# Patient Record
Sex: Female | Born: 1950 | Race: White | Hispanic: No | Smoking: Former smoker
Health system: Southern US, Community
[De-identification: ages and names within clinical notes are randomized; demographics above are authoritative.]

## PROBLEM LIST (undated history)

## (undated) DIAGNOSIS — N651 Disproportion of reconstructed breast: Secondary | ICD-10-CM

## (undated) DIAGNOSIS — M1990 Unspecified osteoarthritis, unspecified site: Secondary | ICD-10-CM

## (undated) DIAGNOSIS — I2510 Atherosclerotic heart disease of native coronary artery without angina pectoris: Secondary | ICD-10-CM

## (undated) DIAGNOSIS — C801 Malignant (primary) neoplasm, unspecified: Secondary | ICD-10-CM

## (undated) HISTORY — PX: CHOLECYSTECTOMY: SHX55

---

## 2020-04-08 ENCOUNTER — Other Ambulatory Visit: Payer: Self-pay | Admitting: Registered Nurse

## 2020-04-29 ENCOUNTER — Other Ambulatory Visit: Payer: Self-pay | Admitting: Registered Nurse

## 2020-04-29 DIAGNOSIS — E2839 Other primary ovarian failure: Secondary | ICD-10-CM

## 2020-04-29 DIAGNOSIS — Z1231 Encounter for screening mammogram for malignant neoplasm of breast: Secondary | ICD-10-CM

## 2020-06-03 ENCOUNTER — Ambulatory Visit
Admission: RE | Admit: 2020-06-03 | Discharge: 2020-06-03 | Disposition: A | Discharge status: Home / Self Care | Payer: Medicare HMO | Source: Ambulatory Visit | Attending: Registered Nurse | Admitting: Registered Nurse

## 2020-06-03 ENCOUNTER — Other Ambulatory Visit: Payer: Self-pay | Admitting: Registered Nurse

## 2020-06-03 ENCOUNTER — Other Ambulatory Visit: Payer: Self-pay

## 2020-06-03 DIAGNOSIS — R0602 Shortness of breath: Secondary | ICD-10-CM

## 2020-06-14 ENCOUNTER — Other Ambulatory Visit: Payer: Self-pay | Admitting: Registered Nurse

## 2020-06-14 DIAGNOSIS — R0602 Shortness of breath: Secondary | ICD-10-CM

## 2020-06-14 DIAGNOSIS — R7989 Other specified abnormal findings of blood chemistry: Secondary | ICD-10-CM

## 2020-10-05 ENCOUNTER — Ambulatory Visit: Payer: Self-pay

## 2020-10-05 ENCOUNTER — Other Ambulatory Visit: Payer: Self-pay

## 2020-10-21 ENCOUNTER — Ambulatory Visit
Admission: RE | Admit: 2020-10-21 | Discharge: 2020-10-21 | Disposition: A | Discharge status: Home / Self Care | Payer: Medicare HMO | Source: Ambulatory Visit | Attending: Registered Nurse | Admitting: Registered Nurse

## 2020-10-21 ENCOUNTER — Other Ambulatory Visit: Payer: Self-pay

## 2020-10-21 DIAGNOSIS — E2839 Other primary ovarian failure: Secondary | ICD-10-CM

## 2020-10-21 DIAGNOSIS — Z1231 Encounter for screening mammogram for malignant neoplasm of breast: Secondary | ICD-10-CM

## 2020-10-22 ENCOUNTER — Other Ambulatory Visit: Payer: Self-pay | Admitting: Registered Nurse

## 2020-10-22 DIAGNOSIS — N63 Unspecified lump in unspecified breast: Secondary | ICD-10-CM

## 2021-01-13 ENCOUNTER — Other Ambulatory Visit (HOSPITAL_BASED_OUTPATIENT_CLINIC_OR_DEPARTMENT_OTHER): Payer: Self-pay

## 2021-03-09 ENCOUNTER — Other Ambulatory Visit: Payer: Self-pay

## 2021-03-09 ENCOUNTER — Emergency Department (HOSPITAL_BASED_OUTPATIENT_CLINIC_OR_DEPARTMENT_OTHER): Payer: Medicare HMO

## 2021-03-09 ENCOUNTER — Encounter (HOSPITAL_BASED_OUTPATIENT_CLINIC_OR_DEPARTMENT_OTHER): Payer: Self-pay

## 2021-03-09 ENCOUNTER — Emergency Department (HOSPITAL_BASED_OUTPATIENT_CLINIC_OR_DEPARTMENT_OTHER)
Admission: EM | Admit: 2021-03-09 | Discharge: 2021-03-09 | Disposition: A | Discharge status: Home / Self Care | Payer: Medicare HMO | Attending: Emergency Medicine | Admitting: Emergency Medicine

## 2021-03-09 ENCOUNTER — Other Ambulatory Visit (HOSPITAL_BASED_OUTPATIENT_CLINIC_OR_DEPARTMENT_OTHER): Payer: Self-pay

## 2021-03-09 DIAGNOSIS — K529 Noninfective gastroenteritis and colitis, unspecified: Secondary | ICD-10-CM | POA: Insufficient documentation

## 2021-03-09 DIAGNOSIS — R1011 Right upper quadrant pain: Secondary | ICD-10-CM

## 2021-03-09 DIAGNOSIS — Z859 Personal history of malignant neoplasm, unspecified: Secondary | ICD-10-CM | POA: Diagnosis not present

## 2021-03-09 DIAGNOSIS — Z20822 Contact with and (suspected) exposure to covid-19: Secondary | ICD-10-CM | POA: Insufficient documentation

## 2021-03-09 DIAGNOSIS — D72829 Elevated white blood cell count, unspecified: Secondary | ICD-10-CM | POA: Diagnosis not present

## 2021-03-09 DIAGNOSIS — Z87891 Personal history of nicotine dependence: Secondary | ICD-10-CM | POA: Diagnosis not present

## 2021-03-09 DIAGNOSIS — I2510 Atherosclerotic heart disease of native coronary artery without angina pectoris: Secondary | ICD-10-CM | POA: Diagnosis not present

## 2021-03-09 DIAGNOSIS — R109 Unspecified abdominal pain: Secondary | ICD-10-CM

## 2021-03-09 HISTORY — DX: Unspecified osteoarthritis, unspecified site: M19.90

## 2021-03-09 HISTORY — DX: Atherosclerotic heart disease of native coronary artery without angina pectoris: I25.10

## 2021-03-09 HISTORY — DX: Disproportion of reconstructed breast: N65.1

## 2021-03-09 HISTORY — DX: Malignant (primary) neoplasm, unspecified: C80.1

## 2021-03-09 LAB — CBC WITH DIFFERENTIAL/PLATELET
Abs Immature Granulocytes: 0.09 10*3/uL — ABNORMAL HIGH (ref 0.00–0.07)
Basophils Absolute: 0 10*3/uL (ref 0.0–0.1)
Basophils Relative: 0 %
Eosinophils Absolute: 0.3 10*3/uL (ref 0.0–0.5)
Eosinophils Relative: 3 %
HCT: 48.2 % — ABNORMAL HIGH (ref 36.0–46.0)
Hemoglobin: 16 g/dL — ABNORMAL HIGH (ref 12.0–15.0)
Immature Granulocytes: 1 %
Lymphocytes Relative: 16 %
Lymphs Abs: 1.7 10*3/uL (ref 0.7–4.0)
MCH: 32.1 pg (ref 26.0–34.0)
MCHC: 33.2 g/dL (ref 30.0–36.0)
MCV: 96.8 fL (ref 80.0–100.0)
Monocytes Absolute: 0.7 10*3/uL (ref 0.1–1.0)
Monocytes Relative: 7 %
Neutro Abs: 7.8 10*3/uL — ABNORMAL HIGH (ref 1.7–7.7)
Neutrophils Relative %: 73 %
Platelets: 252 10*3/uL (ref 150–400)
RBC: 4.98 MIL/uL (ref 3.87–5.11)
RDW: 13.2 % (ref 11.5–15.5)
WBC: 10.6 10*3/uL — ABNORMAL HIGH (ref 4.0–10.5)
nRBC: 0 % (ref 0.0–0.2)

## 2021-03-09 LAB — COMPREHENSIVE METABOLIC PANEL
ALT: 25 U/L (ref 0–44)
AST: 19 U/L (ref 15–41)
Albumin: 3.8 g/dL (ref 3.5–5.0)
Alkaline Phosphatase: 79 U/L (ref 38–126)
Anion gap: 10 (ref 5–15)
BUN: 12 mg/dL (ref 8–23)
CO2: 25 mmol/L (ref 22–32)
Calcium: 8.4 mg/dL — ABNORMAL LOW (ref 8.9–10.3)
Chloride: 104 mmol/L (ref 98–111)
Creatinine, Ser: 0.62 mg/dL (ref 0.44–1.00)
GFR, Estimated: >60 mL/min (ref >60)
Glucose, Bld: 86 mg/dL (ref 70–99)
Potassium: 3.7 mmol/L (ref 3.5–5.1)
Sodium: 139 mmol/L (ref 135–145)
Total Bilirubin: 0.6 mg/dL (ref 0.3–1.2)
Total Protein: 6.4 g/dL — ABNORMAL LOW (ref 6.5–8.1)

## 2021-03-09 LAB — URINALYSIS, ROUTINE W REFLEX MICROSCOPIC
Bilirubin Urine: NEGATIVE
Glucose, UA: NEGATIVE mg/dL
Ketones, ur: 15 mg/dL — AB
Leukocytes,Ua: NEGATIVE
Nitrite: NEGATIVE
Protein, ur: NEGATIVE mg/dL
Specific Gravity, Urine: >1.046 — ABNORMAL HIGH (ref 1.005–1.030)
pH: 7.5 (ref 5.0–8.0)

## 2021-03-09 LAB — RESP PANEL BY RT-PCR (FLU A&B, COVID) ARPGX2
Influenza A by PCR: NEGATIVE
Influenza B by PCR: NEGATIVE
SARS Coronavirus 2 by RT PCR: NEGATIVE

## 2021-03-09 LAB — C DIFFICILE QUICK SCREEN W PCR REFLEX
C Diff antigen: NEGATIVE
C Diff interpretation: NOT DETECTED
C Diff toxin: NEGATIVE

## 2021-03-09 LAB — LIPASE, BLOOD: Lipase: 14 U/L (ref 11–51)

## 2021-03-09 MED ORDER — ONDANSETRON 4 MG PO TBDP
4.0000 mg | ORAL_TABLET | Freq: Once | ORAL | Status: AC
  Administered 2021-03-09: 4 mg via ORAL
  Filled 2021-03-09: qty 1

## 2021-03-09 MED ORDER — SODIUM CHLORIDE 0.9 % IV BOLUS
500.0000 mL | Freq: Once | INTRAVENOUS | Status: AC
  Administered 2021-03-09: 500 mL via INTRAVENOUS

## 2021-03-09 MED ORDER — VANCOMYCIN HCL 125 MG PO CAPS
125.0000 mg | ORAL_CAPSULE | Freq: Four times a day (QID) | ORAL | 0 refills | Status: AC
  Filled 2021-03-09: qty 40, 10d supply, fill #0

## 2021-03-09 MED ORDER — ONDANSETRON 4 MG PO TBDP: ORAL_TABLET | ORAL | 0 refills | Status: AC

## 2021-03-09 MED ORDER — ONDANSETRON HCL 4 MG/2ML IJ SOLN
4.0000 mg | Freq: Once | INTRAMUSCULAR | Status: AC
  Administered 2021-03-09: 4 mg via INTRAVENOUS
  Filled 2021-03-09: qty 2

## 2021-03-09 MED ORDER — IOHEXOL 300 MG/ML  SOLN
100.0000 mL | Freq: Once | INTRAMUSCULAR | Status: AC | PRN
  Administered 2021-03-09: 100 mL via INTRAVENOUS

## 2021-03-09 NOTE — Discharge Instructions (Signed)
If your C. difficile test is positive discussed with your doctor, vancomycin has been called in. Use Zofran as needed for nausea and vomiting. Gradually increase liquid and soft food as tolerated. Return for new or worsening signs or symptoms.

## 2021-03-09 NOTE — ED Triage Notes (Signed)
Onset yesterday of vomiting and diarrhea.  RUQ abdominal pain.  Fever last night of 102

## 2021-03-09 NOTE — ED Notes (Signed)
IV placed in the L AC with no complications noted.

## 2021-03-09 NOTE — ED Notes (Signed)
EDP requested to have patient try fluids; patient was unsure about water or other fluids, but was open to trying ice chips. Patient given ice to try. EDP and RN made aware.

## 2021-03-09 NOTE — ED Provider Notes (Signed)
[facility] Provider Note   CSN: [id number] Arrival date & time: 03/09/21  3382     History Chief Complaint  Patient presents with   Emesis    With fever    Jacqueline Santos is a 70 y.o. female.  Patient with history of coronary artery disease, cholecystectomy presents with recurrent vomiting nonbloody nonbilious multiple episodes and now also diarrhea nonbloody worse the past 24 hours.  Patient had fever 102 last night.  No significant sick contacts.  Symptoms intermittent however more frequent.  Patient recently finished course of antibiotics for pneumonia.      Past Medical History:  Diagnosis Date   Arthritis    Breast reconstruction disproportion    Cancer [facility])    Coronary artery disease     There are no problems to display for this patient.   Past Surgical History:  Procedure Laterality Date   CHOLECYSTECTOMY       OB History   No obstetric history on file.     No family history on file.  Social History   Tobacco Use   Smoking status: Former    Types: Cigarettes  Substance Use Topics   Alcohol use: Never   Drug use: Yes    Types: Marijuana    Home Medications Prior to Admission medications   Medication Sig Start Date End Date Taking? Authorizing Provider  ondansetron (ZOFRAN ODT) 4 MG disintegrating tablet 4mg  ODT q4 hours prn nausea/vomit 03/09/21  Yes Elnora Morrison, MD  vancomycin (VANCOCIN) 125 MG capsule Take 1 capsule (125 mg total) by mouth 4 (four) times daily for 10 days. 03/09/21 03/19/21 Yes Elnora Morrison, MD    Allergies    Amoxicillin and Penicillins  Review of Systems   Review of Systems  Constitutional:  Negative for chills and fever.  HENT:  Positive for congestion.   Eyes:  Negative for visual disturbance.  Respiratory:  Positive for cough. Negative for shortness of breath.   Cardiovascular:  Negative for chest pain.  Gastrointestinal:  Positive for abdominal pain, diarrhea, nausea and  vomiting.  Genitourinary:  Negative for dysuria and flank pain.  Musculoskeletal:  Negative for back pain, neck pain and neck stiffness.  Skin:  Negative for rash.  Neurological:  Positive for weakness. Negative for light-headedness and headaches.   Physical Exam Updated Vital Signs BP 118/62   Pulse (!) 50   Temp 98.6 F (37 C) (Oral)   Resp 20   Ht 5\' 3"  (1.6 m)   Wt 78 kg   SpO2 98%   BMI 30.47 kg/m   Physical Exam Vitals and nursing note reviewed.  Constitutional:      General: She is not in acute distress.    Appearance: She is well-developed.  HENT:     Head: Normocephalic and atraumatic.     Mouth/Throat:     Mouth: Mucous membranes are dry.  Eyes:     General:        Right eye: No discharge.        Left eye: No discharge.     Conjunctiva/sclera: Conjunctivae normal.  Neck:     Trachea: No tracheal deviation.  Cardiovascular:     Rate and Rhythm: Normal rate and regular rhythm.     Heart sounds: No murmur heard. Pulmonary:     Effort: Pulmonary effort is normal.     Breath sounds: Normal breath sounds.  Abdominal:     General: There is no distension.     Palpations: Abdomen  is soft.     Tenderness: There is abdominal tenderness (RUQ). There is no guarding.  Musculoskeletal:     Cervical back: Normal range of motion and neck supple. No rigidity.  Skin:    General: Skin is warm.     Capillary Refill: Capillary refill takes less than 2 seconds.     Findings: No rash.  Neurological:     General: No focal deficit present.     Mental Status: She is alert.     Cranial Nerves: No cranial nerve deficit.  Psychiatric:        Mood and Affect: Mood normal.    ED Results / Procedures / Treatments   Labs (all labs ordered are listed, but only abnormal results are displayed) Labs Reviewed  CBC WITH DIFFERENTIAL/PLATELET - Abnormal; Notable for the following components:      Result Value   WBC 10.6 (*)    Hemoglobin 16.0 (*)    HCT 48.2 (*)    Neutro Abs 7.8  (*)    Abs Immature Granulocytes 0.09 (*)    All other components within normal limits  URINALYSIS, ROUTINE W REFLEX MICROSCOPIC - Abnormal; Notable for the following components:   Specific Gravity, Urine >1.046 (*)    Hgb urine dipstick TRACE (*)    Ketones, ur 15 (*)    Bacteria, UA RARE (*)    All other components within normal limits  COMPREHENSIVE METABOLIC PANEL - Abnormal; Notable for the following components:   Calcium 8.4 (*)    Total Protein 6.4 (*)    All other components within normal limits  RESP PANEL BY RT-PCR (FLU A&B, COVID) [id number]  C DIFFICILE QUICK SCREEN W PCR REFLEX    LIPASE, BLOOD    EKG None  Radiology CT ABDOMEN PELVIS W CONTRAST  Result Date: 03/09/2021 CLINICAL DATA:  Abdominal pain, fever EXAM: CT ABDOMEN AND PELVIS WITH CONTRAST TECHNIQUE: Multidetector CT imaging of the abdomen and pelvis was performed using the standard protocol following bolus administration of intravenous contrast. CONTRAST:  164mL OMNIPAQUE IOHEXOL 300 MG/ML  SOLN COMPARISON:  None. FINDINGS: Lower chest: No acute abnormality. Hepatobiliary: Liver is normal in size and contour. Subcentimeter hypodensity in the lateral right hepatic lobe most likely represents a cyst. Gallbladder is surgically absent. No biliary ductal dilatation identified. Pancreas: Unremarkable. No pancreatic ductal dilatation or surrounding inflammatory changes. Spleen: Normal in size without focal abnormality. Adrenals/Urinary Tract: Adrenal glands are unremarkable. Kidneys are normal, without renal calculi, suspicious lesion, or hydronephrosis. Subcentimeter likely cyst in the upper left kidney. Bladder is unremarkable. Stomach/Bowel: Small hiatal hernia. No bowel obstruction, free air or pneumatosis. Colonic diverticulosis. No bowel wall edema identified. Appendix not visualized. Vascular/Lymphatic: Aortic atherosclerosis. No enlarged abdominal or pelvic lymph nodes. Reproductive: Status post hysterectomy. No adnexal  masses. Other: No ascites. Surgical changes to the anterior abdominal wall. 1.5 cm round well-defined subcutaneous nodular density in the right gluteal region. Musculoskeletal: Degenerative changes of the lumbar spine. No suspicious bony lesions identified. IMPRESSION: 1. No acute process identified. 2. Colonic diverticulosis. 3. Small hiatal hernia. 4. Other chronic findings as described. Electronically Signed   By: Ofilia Neas M.D.   On: 03/09/2021 10:32   DG Chest Portable 1 View  Result Date: 03/09/2021 CLINICAL DATA:  cough, fever EXAM: PORTABLE CHEST 1 VIEW COMPARISON:  Radiograph 06/03/2020 FINDINGS: Unchanged cardiomediastinal silhouette. There is no focal airspace disease. There is no large pleural effusion or visible pneumothorax. Bilateral shoulder degenerative changes. Thoracic spondylosis. No acute osseous abnormality. IMPRESSION: No  evidence of acute cardiopulmonary disease Electronically Signed   By: Maurine Simmering M.D.   On: 03/09/2021 09:19    Procedures Procedures   Medications Ordered in ED Medications  sodium chloride 0.9 % bolus 500 mL (0 mLs Intravenous Stopped 03/09/21 1210)  ondansetron (ZOFRAN-ODT) disintegrating tablet 4 mg (4 mg Oral Given 03/09/21 0911)  iohexol (OMNIPAQUE) 300 MG/ML solution 100 mL (100 mLs Intravenous Contrast Given 03/09/21 1013)  sodium chloride 0.9 % bolus 500 mL (0 mLs Intravenous Stopped 03/09/21 1255)  ondansetron (ZOFRAN) injection 4 mg (4 mg Intravenous Given 03/09/21 1110)  sodium chloride 0.9 % bolus 500 mL (0 mLs Intravenous Stopped 03/09/21 1333)    ED Course  I have reviewed the triage vital signs and the nursing notes.  Pertinent labs & imaging results that were available during my care of the patient were reviewed by me and considered in my medical decision making (see chart for details).    MDM Rules/Calculators/A&P                           Patient presents with recurrent nausea and vomiting and abdominal pain differential  includes liver/hepatitis, gastroenteritis, secondary effects to recent antibiotics/C. difficile, bowel obstruction/bowel related, other.  Plan for IV fluids, antiemetics, blood work and CT scan for further delineation.  Patient gradually improved in ER, 2 IV fluid boluses given.  Patient has sinus bradycardia which is normal for her.  Patient has no lightheadedness or syncope.  Blood work reviewed overall unremarkable mild leukocytosis 10, hemoglobin 16.  Oral fluids discussed with nursing.  CT no abscess, no acute findings.  With recent antibiotics considered C. difficile, test sent for close outpatient follow-up.  Prescription given/sent to the pharmacy in case symptoms worsen or positive testing.  Zofran for home.   Final Clinical Impression(s) / ED Diagnoses Final diagnoses:  Right upper quadrant abdominal pain  Abdominal pain, vomiting, and diarrhea  Colitis    Rx / DC Orders ED Discharge Orders          Ordered    vancomycin (VANCOCIN) 125 MG capsule  4 times daily        03/09/21 1513    ondansetron (ZOFRAN ODT) 4 MG disintegrating tablet        03/09/21 1513             Elnora Morrison, MD 03/09/21 1517

## 2021-03-10 ENCOUNTER — Other Ambulatory Visit (HOSPITAL_BASED_OUTPATIENT_CLINIC_OR_DEPARTMENT_OTHER): Payer: Self-pay

## 2021-03-21 ENCOUNTER — Other Ambulatory Visit (HOSPITAL_BASED_OUTPATIENT_CLINIC_OR_DEPARTMENT_OTHER): Payer: Self-pay

## 2023-04-26 IMAGING — DX DG CHEST 1V PORT
1 series · 1 of 1 positions shown · non-contrast
Comparison: Radiograph 06/03/2020

CLINICAL DATA: cough, fever

EXAM:
PORTABLE CHEST 1 VIEW

[chest]
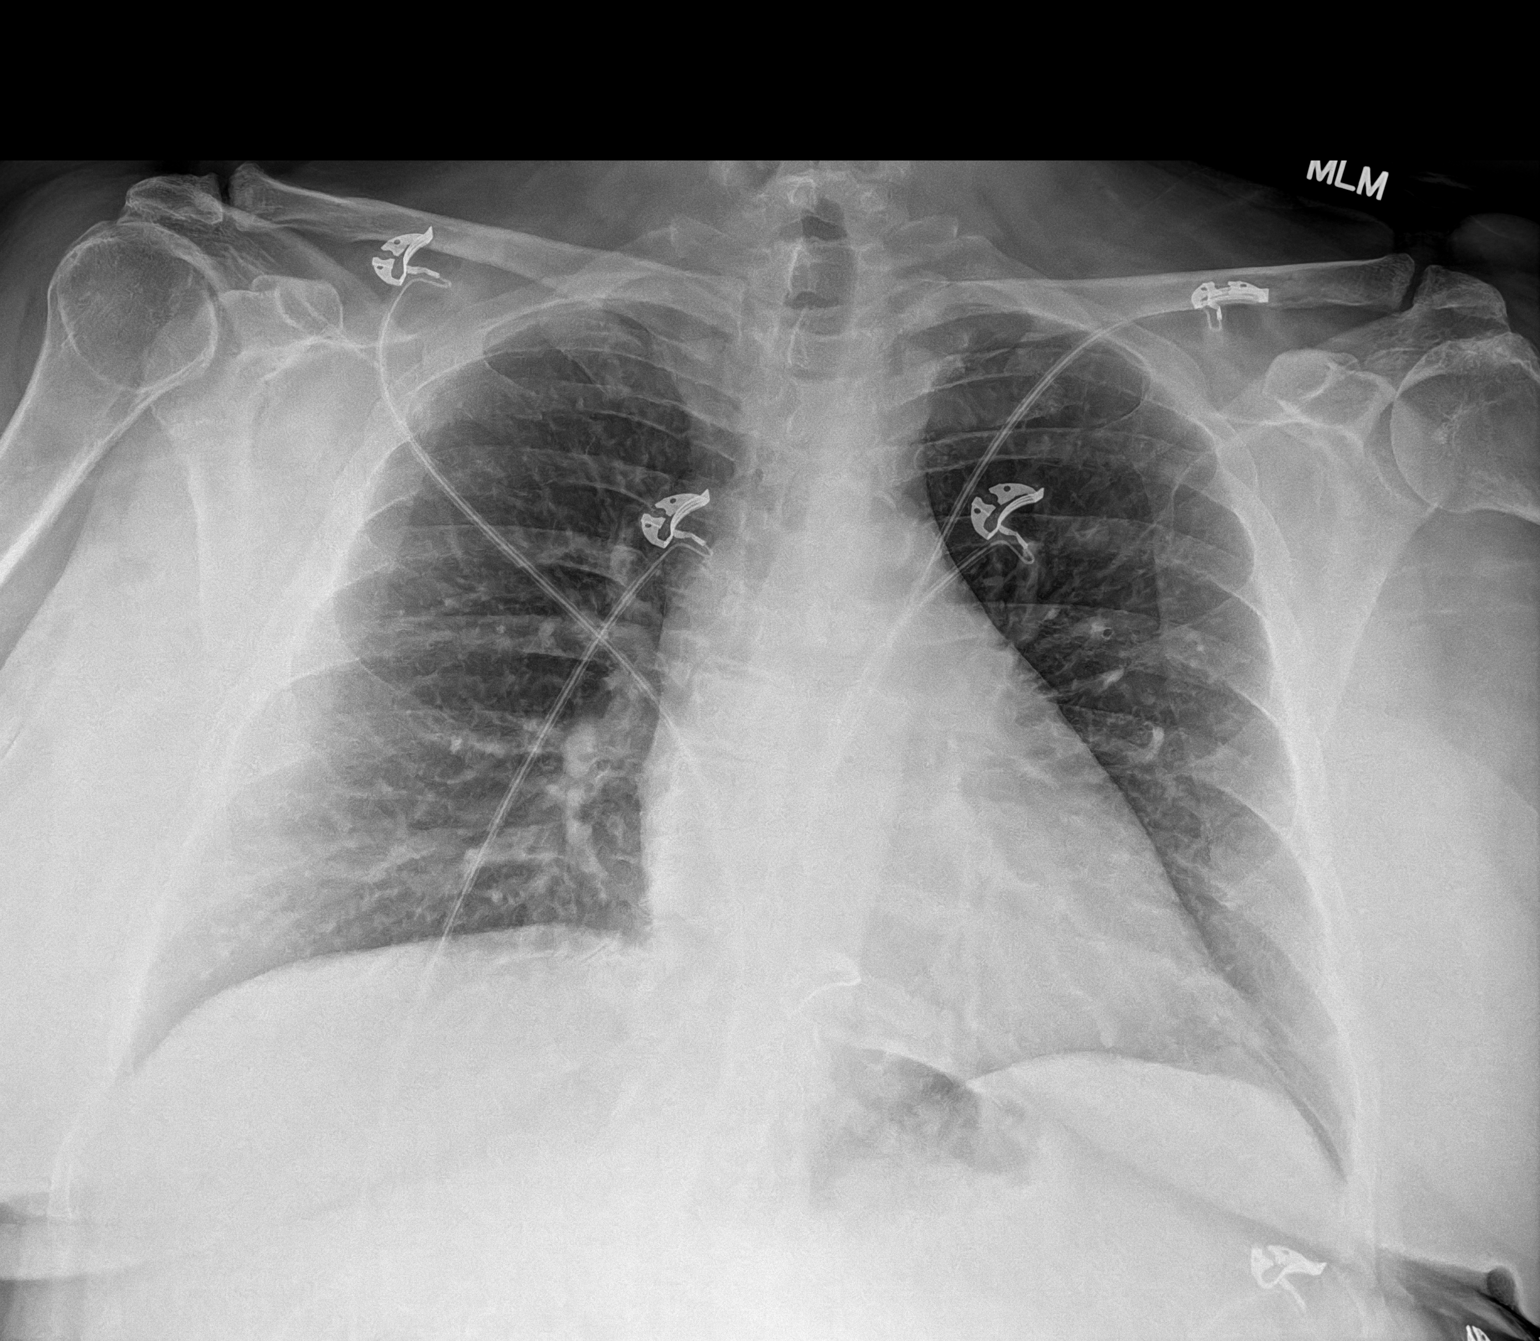

[1 of 1 positions shown; findings below may reference images not displayed]

FINDINGS: Unchanged cardiomediastinal silhouette. There is no focal airspace
disease. There is no large pleural effusion or visible pneumothorax.
Bilateral shoulder degenerative changes. Thoracic spondylosis. No
acute osseous abnormality.
IMPRESSION: No evidence of acute cardiopulmonary disease

## 2023-04-26 IMAGING — CT CT ABD-PELV W/ CM
2 of 5 series · 17 of 46 positions shown, 19 images · IV contrast (APPLIED)
Comparison: None.

CLINICAL DATA: Abdominal pain, fever

EXAM:
CT ABDOMEN AND PELVIS WITH CONTRAST
TECHNIQUE: Multidetector CT imaging of the abdomen and pelvis was performed
using the standard protocol following bolus administration of
intravenous contrast.
CONTRAST:  100mL OMNIPAQUE IOHEXOL 300 MG/ML  SOLN

[Series 2: abd pel w · axial · 0.88mm/px · z∈[-494,-99]mm · 14 of 89 slices shown, 16 images]
[im 5/89  soft-tissue]
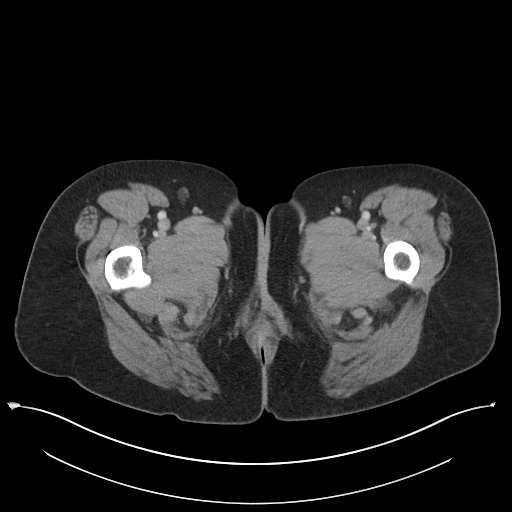
[im 5/89  bone]
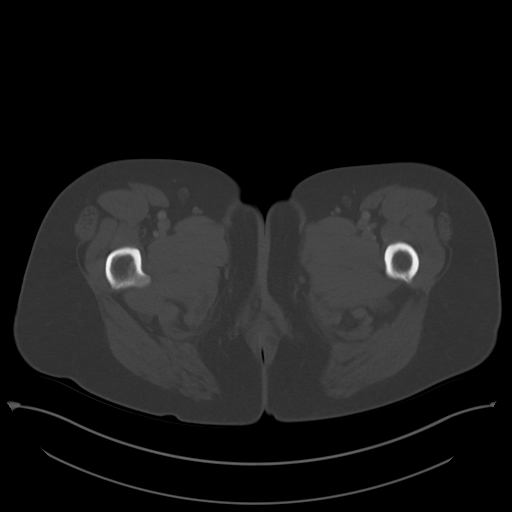
[im 10/89  soft-tissue]
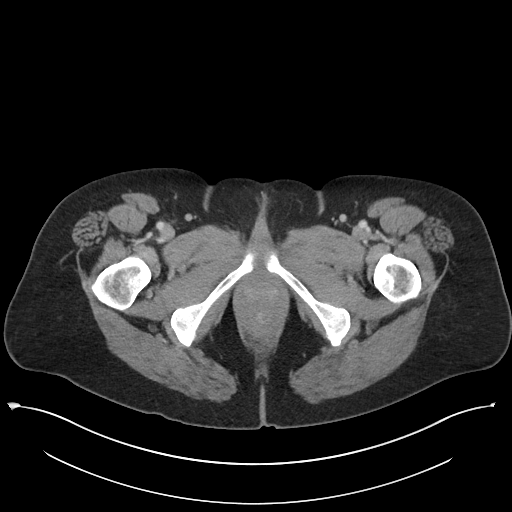
[im 19/89  soft-tissue]
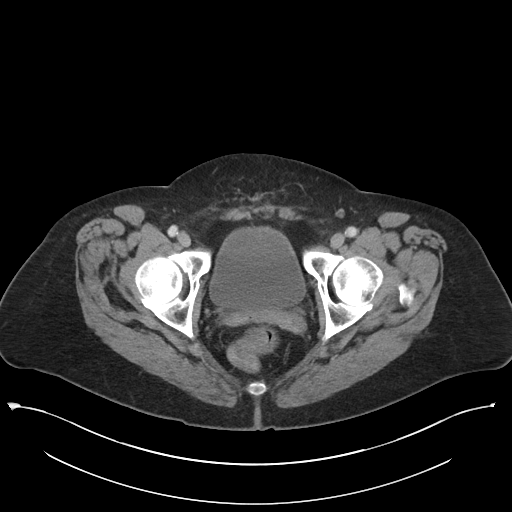
[im 24/89  soft-tissue]
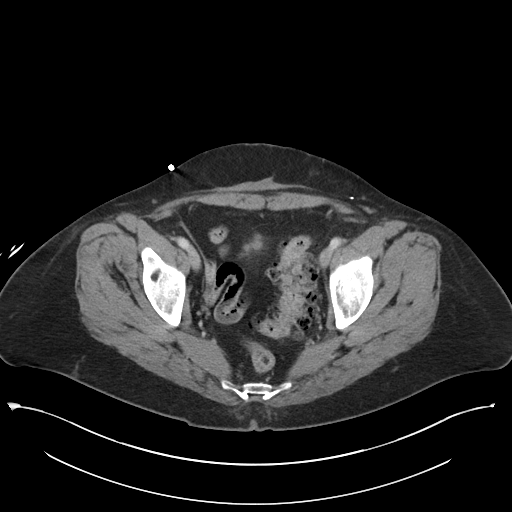
[im 28/89  soft-tissue]
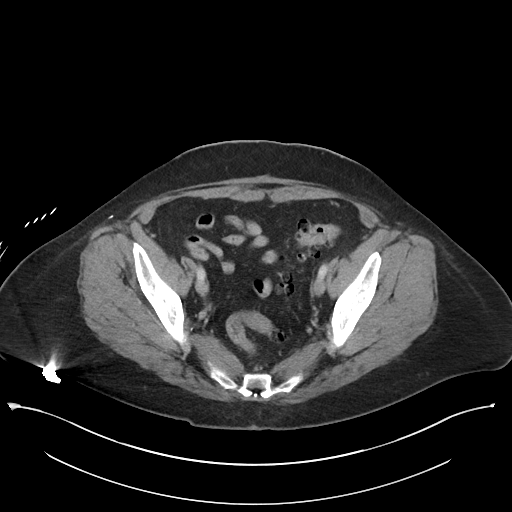
[im 38/89  soft-tissue]
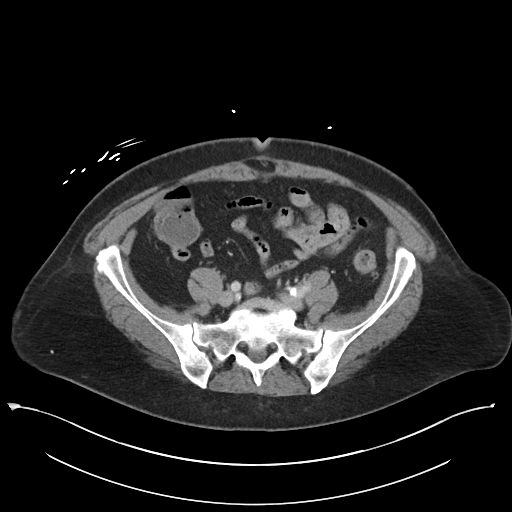
[im 42/89  soft-tissue]
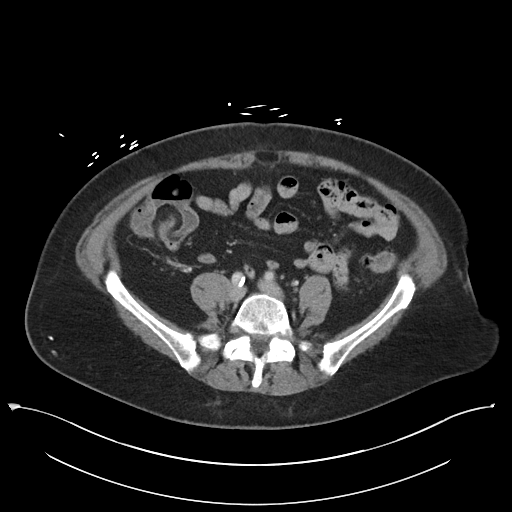
[im 47/89  soft-tissue]
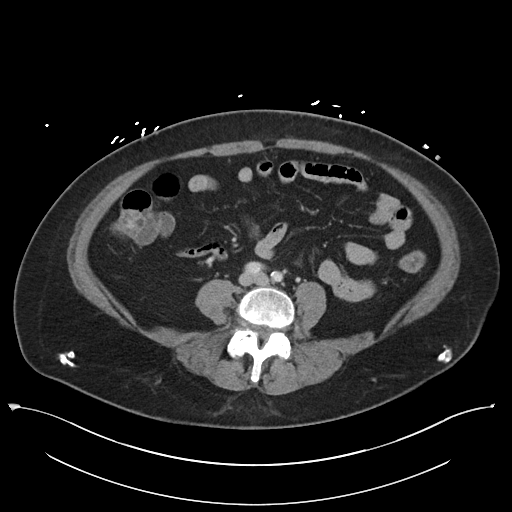
[im 51/89  soft-tissue]
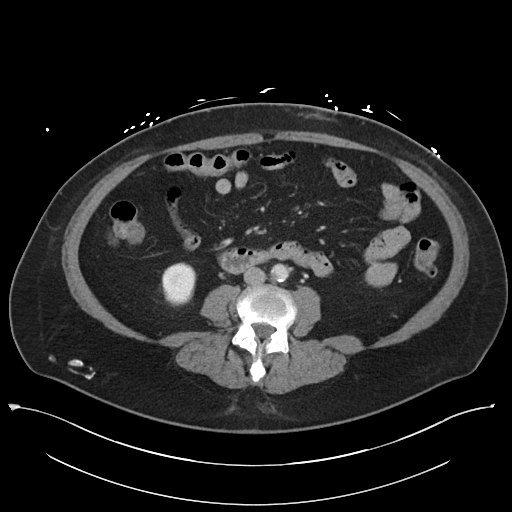
[im 51/89  bone]
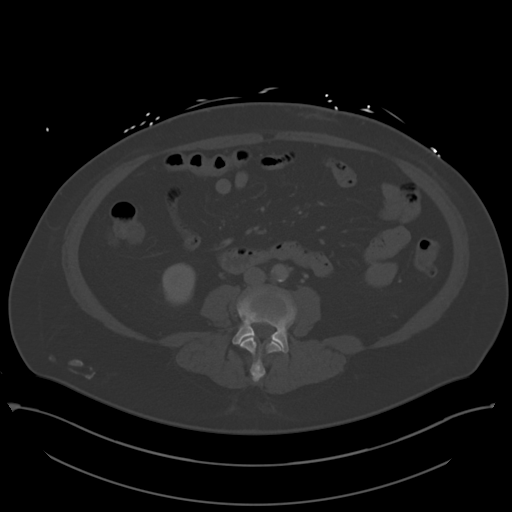
[im 61/89  soft-tissue]
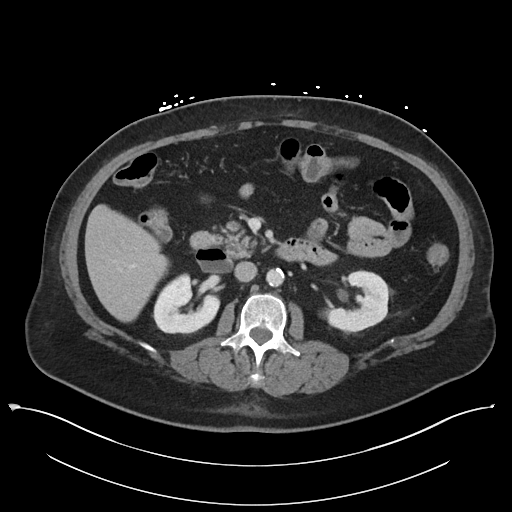
[im 65/89  soft-tissue]
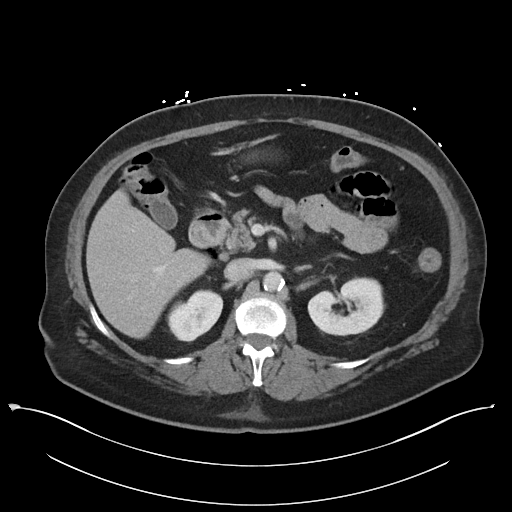
[im 70/89  soft-tissue]
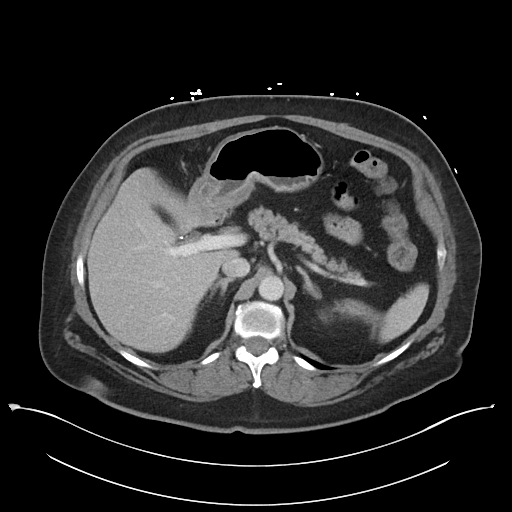
[im 79/89  soft-tissue]
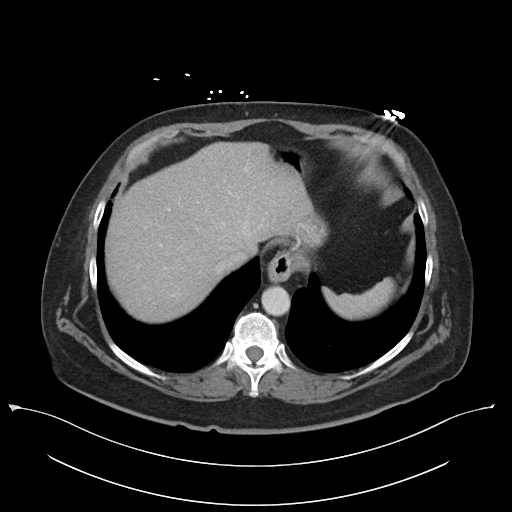
[im 84/89  soft-tissue]
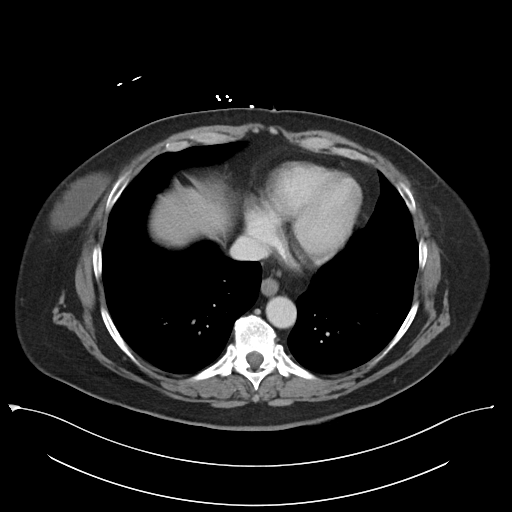

[Series 5: coronal · coronal · 0.86mm/px · 3 of 108 slices shown]
[im 36/108  soft-tissue]
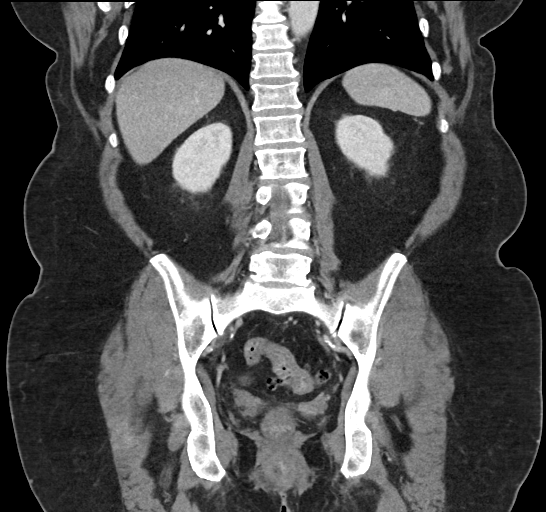
[im 48/108  soft-tissue]
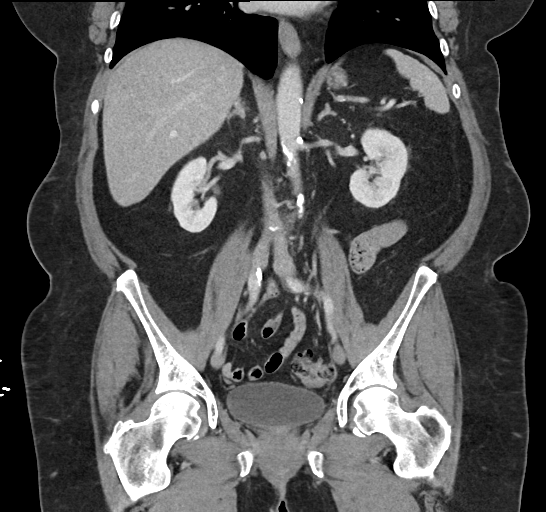
[im 60/108  soft-tissue]
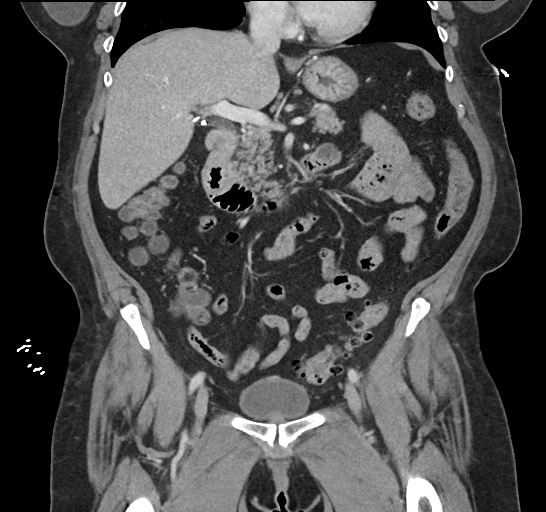

[17 of 46 positions shown; findings below may reference images not displayed]

FINDINGS: Lower chest: No acute abnormality.

Hepatobiliary: Liver is normal in size and contour. Subcentimeter
hypodensity in the lateral right hepatic lobe most likely represents
a cyst. Gallbladder is surgically absent. No biliary ductal
dilatation identified.

Pancreas: Unremarkable. No pancreatic ductal dilatation or
surrounding inflammatory changes.

Spleen: Normal in size without focal abnormality.

Adrenals/Urinary Tract: Adrenal glands are unremarkable. Kidneys are
normal, without renal calculi, suspicious lesion, or hydronephrosis.
Subcentimeter likely cyst in the upper left kidney. Bladder is
unremarkable.

Stomach/Bowel: Small hiatal hernia. No bowel obstruction, free air
or pneumatosis. Colonic diverticulosis. No bowel wall edema
identified. Appendix not visualized.

Vascular/Lymphatic: Aortic atherosclerosis. No enlarged abdominal or
pelvic lymph nodes.

Reproductive: Status post hysterectomy. No adnexal masses.

Other: No ascites. Surgical changes to the anterior abdominal wall.
1.5 cm round well-defined subcutaneous nodular density in the right
gluteal region.

Musculoskeletal: Degenerative changes of the lumbar spine. No
suspicious bony lesions identified.
IMPRESSION: 1. No acute process identified.
2. Colonic diverticulosis.
3. Small hiatal hernia.
4. Other chronic findings as described.
